# Patient Record
Sex: Female | Born: 1975 | Hispanic: No | Smoking: Never smoker
Health system: Southern US, Community
[De-identification: ages and names within clinical notes are randomized; demographics above are authoritative.]

## PROBLEM LIST (undated history)

## (undated) DIAGNOSIS — B1920 Unspecified viral hepatitis C without hepatic coma: Secondary | ICD-10-CM

## (undated) DIAGNOSIS — G43909 Migraine, unspecified, not intractable, without status migrainosus: Secondary | ICD-10-CM

---

## 2010-01-13 ENCOUNTER — Emergency Department (HOSPITAL_BASED_OUTPATIENT_CLINIC_OR_DEPARTMENT_OTHER): Admission: EM | Admit: 2010-01-13 | Discharge: 2010-01-13 | Payer: Self-pay | Admitting: Emergency Medicine

## 2010-01-31 ENCOUNTER — Emergency Department (HOSPITAL_BASED_OUTPATIENT_CLINIC_OR_DEPARTMENT_OTHER): Admission: EM | Admit: 2010-01-31 | Discharge: 2010-01-31 | Payer: Self-pay | Admitting: Emergency Medicine

## 2010-03-08 ENCOUNTER — Emergency Department (HOSPITAL_BASED_OUTPATIENT_CLINIC_OR_DEPARTMENT_OTHER): Admission: EM | Admit: 2010-03-08 | Discharge: 2010-03-08 | Payer: Self-pay | Admitting: Emergency Medicine

## 2010-04-13 ENCOUNTER — Emergency Department (HOSPITAL_BASED_OUTPATIENT_CLINIC_OR_DEPARTMENT_OTHER)
Admission: EM | Admit: 2010-04-13 | Discharge: 2010-04-13 | Payer: Self-pay | Source: Home / Self Care | Admitting: Emergency Medicine

## 2010-06-12 ENCOUNTER — Encounter (HOSPITAL_BASED_OUTPATIENT_CLINIC_OR_DEPARTMENT_OTHER): Payer: Self-pay | Admitting: Radiology

## 2010-06-12 ENCOUNTER — Emergency Department (HOSPITAL_BASED_OUTPATIENT_CLINIC_OR_DEPARTMENT_OTHER)
Admission: EM | Admit: 2010-06-12 | Discharge: 2010-06-12 | Disposition: A | Discharge status: Home / Self Care | Payer: Medicaid Other | Attending: Emergency Medicine | Admitting: Emergency Medicine

## 2010-06-12 ENCOUNTER — Emergency Department (INDEPENDENT_AMBULATORY_CARE_PROVIDER_SITE_OTHER): Payer: Medicaid Other

## 2010-06-12 DIAGNOSIS — R51 Headache: Secondary | ICD-10-CM | POA: Insufficient documentation

## 2010-06-12 HISTORY — DX: Migraine, unspecified, not intractable, without status migrainosus: G43.909

## 2010-06-12 LAB — COMPREHENSIVE METABOLIC PANEL
ALT: 63 U/L — ABNORMAL HIGH (ref 0–35)
AST: 88 U/L — ABNORMAL HIGH (ref 0–37)
Alkaline Phosphatase: 107 U/L (ref 39–117)
CO2: 22 mEq/L (ref 19–32)
Calcium: 9 mg/dL (ref 8.4–10.5)
Chloride: 109 mEq/L (ref 96–112)
GFR calc Af Amer: >60 mL/min (ref >60)
GFR calc non Af Amer: >60 mL/min (ref >60)
Glucose, Bld: 144 mg/dL — ABNORMAL HIGH (ref 70–99)
Potassium: 4.1 mEq/L (ref 3.5–5.1)
Sodium: 143 mEq/L (ref 135–145)

## 2010-06-12 LAB — CBC
HCT: 40.7 % (ref 36.0–46.0)
Hemoglobin: 14.2 g/dL (ref 12.0–15.0)
MCHC: 34.9 g/dL (ref 30.0–36.0)
RBC: 5.2 MIL/uL — ABNORMAL HIGH (ref 3.87–5.11)
WBC: 10 10*3/uL (ref 4.0–10.5)

## 2010-06-12 LAB — DIFFERENTIAL
Basophils Absolute: 0 10*3/uL (ref 0.0–0.1)
Basophils Relative: 0 % (ref 0–1)
Lymphocytes Relative: 18 % (ref 12–46)
Monocytes Absolute: 0.3 10*3/uL (ref 0.1–1.0)
Neutro Abs: 7.8 10*3/uL — ABNORMAL HIGH (ref 1.7–7.7)
Neutrophils Relative %: 78 % — ABNORMAL HIGH (ref 43–77)

## 2010-06-12 LAB — URINALYSIS, ROUTINE W REFLEX MICROSCOPIC
Bilirubin Urine: NEGATIVE
Ketones, ur: 15 mg/dL — AB
Nitrite: NEGATIVE
Protein, ur: NEGATIVE mg/dL
pH: 7.5 (ref 5.0–8.0)

## 2010-07-03 ENCOUNTER — Emergency Department (INDEPENDENT_AMBULATORY_CARE_PROVIDER_SITE_OTHER): Payer: Medicaid Other

## 2010-07-03 ENCOUNTER — Emergency Department (HOSPITAL_BASED_OUTPATIENT_CLINIC_OR_DEPARTMENT_OTHER)
Admission: EM | Admit: 2010-07-03 | Discharge: 2010-07-03 | Disposition: A | Discharge status: Home / Self Care | Payer: Medicaid Other | Attending: Emergency Medicine | Admitting: Emergency Medicine

## 2010-07-03 DIAGNOSIS — Z79899 Other long term (current) drug therapy: Secondary | ICD-10-CM | POA: Insufficient documentation

## 2010-07-03 DIAGNOSIS — R05 Cough: Secondary | ICD-10-CM | POA: Insufficient documentation

## 2010-07-03 DIAGNOSIS — R059 Cough, unspecified: Secondary | ICD-10-CM | POA: Insufficient documentation

## 2010-07-03 DIAGNOSIS — J111 Influenza due to unidentified influenza virus with other respiratory manifestations: Secondary | ICD-10-CM | POA: Insufficient documentation

## 2010-07-03 DIAGNOSIS — R109 Unspecified abdominal pain: Secondary | ICD-10-CM

## 2010-07-03 LAB — DIFFERENTIAL
Basophils Absolute: 0 10*3/uL (ref 0.0–0.1)
Eosinophils Absolute: 0.2 10*3/uL (ref 0.0–0.7)
Eosinophils Relative: 2 % (ref 0–5)
Lymphocytes Relative: 35 % (ref 12–46)
Lymphs Abs: 2.8 10*3/uL (ref 0.7–4.0)
Monocytes Absolute: 0.5 10*3/uL (ref 0.1–1.0)

## 2010-07-03 LAB — COMPREHENSIVE METABOLIC PANEL
AST: 89 U/L — ABNORMAL HIGH (ref 0–37)
BUN: 8 mg/dL (ref 6–23)
CO2: 21 mEq/L (ref 19–32)
Chloride: 111 mEq/L (ref 96–112)
Creatinine, Ser: 0.7 mg/dL (ref 0.4–1.2)
GFR calc non Af Amer: >60 mL/min (ref >60)
Glucose, Bld: 82 mg/dL (ref 70–99)
Total Bilirubin: 0.5 mg/dL (ref 0.3–1.2)

## 2010-07-03 LAB — URINALYSIS, ROUTINE W REFLEX MICROSCOPIC
Bilirubin Urine: NEGATIVE
Hgb urine dipstick: NEGATIVE
Protein, ur: NEGATIVE mg/dL
Urobilinogen, UA: 0.2 mg/dL (ref 0.0–1.0)

## 2010-07-03 LAB — CBC
HCT: 40.9 % (ref 36.0–46.0)
MCHC: 33.7 g/dL (ref 30.0–36.0)
MCV: 79.4 fL (ref 78.0–100.0)
Platelets: 219 10*3/uL (ref 150–400)
RDW: 14 % (ref 11.5–15.5)

## 2010-07-03 LAB — LIPASE, BLOOD: Lipase: 103 U/L (ref 23–300)

## 2010-07-20 LAB — URINALYSIS, ROUTINE W REFLEX MICROSCOPIC
Glucose, UA: NEGATIVE mg/dL
Ketones, ur: NEGATIVE mg/dL
Protein, ur: NEGATIVE mg/dL

## 2010-07-20 LAB — PREGNANCY, URINE: Preg Test, Ur: NEGATIVE

## 2011-02-06 ENCOUNTER — Encounter (HOSPITAL_BASED_OUTPATIENT_CLINIC_OR_DEPARTMENT_OTHER): Payer: Self-pay | Admitting: *Deleted

## 2011-02-06 ENCOUNTER — Emergency Department (HOSPITAL_BASED_OUTPATIENT_CLINIC_OR_DEPARTMENT_OTHER)
Admission: EM | Admit: 2011-02-06 | Discharge: 2011-02-06 | Disposition: A | Discharge status: Home / Self Care | Payer: Medicaid Other | Attending: Emergency Medicine | Admitting: Emergency Medicine

## 2011-02-06 DIAGNOSIS — Z79899 Other long term (current) drug therapy: Secondary | ICD-10-CM | POA: Insufficient documentation

## 2011-02-06 DIAGNOSIS — L03119 Cellulitis of unspecified part of limb: Secondary | ICD-10-CM | POA: Insufficient documentation

## 2011-02-06 DIAGNOSIS — L02619 Cutaneous abscess of unspecified foot: Secondary | ICD-10-CM | POA: Insufficient documentation

## 2011-02-06 HISTORY — DX: Unspecified viral hepatitis C without hepatic coma: B19.20

## 2011-02-06 MED ORDER — OXYCODONE-ACETAMINOPHEN 5-325 MG PO TABS
2.0000 | ORAL_TABLET | Freq: Once | ORAL | Status: AC
  Administered 2011-02-06: 2 via ORAL
  Filled 2011-02-06: qty 2

## 2011-02-06 MED ORDER — LIDOCAINE HCL (PF) 1 % IJ SOLN
5.0000 mL | Freq: Once | INTRAMUSCULAR | Status: AC
  Administered 2011-02-06: 5 mL
  Filled 2011-02-06: qty 5

## 2011-02-06 MED ORDER — SULFAMETHOXAZOLE-TMP DS 800-160 MG PO TABS
2.0000 | ORAL_TABLET | Freq: Once | ORAL | Status: AC
  Administered 2011-02-06: 2 via ORAL
  Filled 2011-02-06: qty 2

## 2011-02-06 MED ORDER — OXYCODONE-ACETAMINOPHEN 5-325 MG PO TABS: 2.0000 | ORAL_TABLET | Freq: Four times a day (QID) | ORAL | Status: DC | PRN

## 2011-02-06 MED ORDER — SULFAMETHOXAZOLE-TMP DS 800-160 MG PO TABS: 2.0000 | ORAL_TABLET | Freq: Two times a day (BID) | ORAL | Status: AC

## 2011-02-06 MED ORDER — CEPHALEXIN 250 MG PO CAPS
500.0000 mg | ORAL_CAPSULE | Freq: Once | ORAL | Status: AC
  Administered 2011-02-06: 500 mg via ORAL
  Filled 2011-02-06: qty 2

## 2011-02-06 MED ORDER — LIDOCAINE 4 % EX CREA
TOPICAL_CREAM | Freq: Once | CUTANEOUS | Status: AC
  Administered 2011-02-06: 1 via TOPICAL
  Filled 2011-02-06: qty 5

## 2011-02-06 MED ORDER — CEPHALEXIN 500 MG PO CAPS: 500.0000 mg | ORAL_CAPSULE | Freq: Four times a day (QID) | ORAL | Status: AC

## 2011-02-06 NOTE — ED Notes (Signed)
Infection right great toe x 1 week. Hot, red, swollen, painful and hard to walk on her right foot.

## 2011-02-06 NOTE — ED Provider Notes (Signed)
History     CSN: [id number] Arrival date & time: 02/06/2011  7:43 PM  Chief Complaint  Patient presents with  . Cellulitis     HPI Patient is a 35 yo F with no prior history of skin infections who presents with 4 day history of right toe pain that is a 10/10.  Palpation and movement make it worse.  Nothing alleviates her pain.  She noticed itching first around a small cut and now she notes sharp pain.  She denies nausea, vomiting, or fever.  She has not been treated for this.  The pain radiates to her foot.  There are no other modifying factors. Past Medical History  Diagnosis Date  . Migraines   . Hepatitis C     Past Surgical History  Procedure Date  . Cesarean section     History reviewed. No pertinent family history.  History  Substance Use Topics  . Smoking status: Never Smoker   . Smokeless tobacco: Not on file  . Alcohol Use: No    OB History    Grav Para Term Preterm Abortions TAB SAB Ect Mult Living                  Review of Systems  All other systems reviewed and are negative.    Allergies  Review of patient's allergies indicates no known allergies.  Home Medications   Current Outpatient Rx  Name Route Sig Dispense Refill  . VITAMIN D 1000 UNITS PO TABS Oral Take 2,000 Units by mouth daily.      Marland Kitchen PROMETHAZINE HCL 25 MG PO TABS Oral Take 25 mg by mouth every 6 (six) hours as needed. For nausea     . SERTRALINE HCL 100 MG PO TABS Oral Take 200 mg by mouth daily.      . SUMATRIPTAN SUCCINATE 100 MG PO TABS Oral Take 100 mg by mouth every 2 (two) hours as needed. For migraine     . VITAMIN C 500 MG PO TABS Oral Take 500 mg by mouth daily.        BP 110/71  Pulse 82  Temp(Src) 98.2 F (36.8 C) (Oral)  Resp 22  SpO2 100%  Physical Exam  Nursing note and vitals reviewed. Constitutional: She is oriented to person, place, and time. She appears well-developed and well-nourished.  HENT:  Head: Normocephalic and atraumatic.  Eyes: Conjunctivae and  EOM are normal. Pupils are equal, round, and reactive to light.  Neck: Normal range of motion.  Cardiovascular: Normal rate, regular rhythm and normal heart sounds.  Exam reveals no gallop and no friction rub.   No murmur heard. Pulmonary/Chest: Effort normal and breath sounds normal. No respiratory distress. She has no wheezes. She has no rales.  Abdominal: Soft. Bowel sounds are normal. She exhibits no distension. There is no tenderness. There is no rebound and no guarding.  Musculoskeletal: Normal range of motion. She exhibits edema and tenderness.       Noted over the proximal phalanx of the R great toe dorsally  Neurological: She is alert and oriented to person, place, and time. No cranial nerve deficit. She exhibits normal muscle tone. Coordination normal.  Skin: Skin is warm and dry.       Patient has erythematous area overlying the dorsum of the proximal phalanx of the R great toe with underlying fluctuance  Psychiatric: She has a normal mood and affect.    ED Course  INCISION AND DRAINAGE Date/Time: 02/06/2011 9:30 PM Performed by:  Larren Copes Authorized by: Cyndra Numbers Consent: Verbal consent obtained. Written consent not obtained. Risks and benefits: risks, benefits and alternatives were discussed Consent given by: patient Patient understanding: patient states understanding of the procedure being performed Relevant documents: relevant documents present and verified Required items: required blood products, implants, devices, and special equipment available Patient identity confirmed: verbally with patient Type: abscess Body area: lower extremity Location details: right big toe Anesthesia: local infiltration Local anesthetic: lidocaine 1% with epinephrine and topical anesthetic Anesthetic total: 5 ml Patient sedated: no Scalpel size: 11 Incision type: single straight Complexity: simple Drainage: purulent and bloody Drainage amount: scant Wound treatment: wound left  open Packing material: none Patient tolerance: Patient tolerated the procedure well with no immediate complications.   Labs Reviewed - No data to display No results found.   1. Cellulitis and abscess of foot       MDM  Patient was evaluated and had cellulitis of her R great toe with underlying abscess.  She was treated with percocet, bactrim, and keflex.  I and D was performed.  Patient tolerated this well.  10 days of antibiotics were given as well as pain meds.  Patient was told to soak her toe 2 x a day in warm soapy water and to push on the area with clean hands to express and additional pus.  Patient stated understanding of plan and was discharged home in good condition.        Cyndra Numbers, MD 02/07/11 1359

## 2011-02-06 NOTE — ED Notes (Signed)
I&D tray at bedside.

## 2011-02-08 ENCOUNTER — Encounter (HOSPITAL_BASED_OUTPATIENT_CLINIC_OR_DEPARTMENT_OTHER): Payer: Self-pay | Admitting: *Deleted

## 2011-02-08 ENCOUNTER — Emergency Department (HOSPITAL_BASED_OUTPATIENT_CLINIC_OR_DEPARTMENT_OTHER)
Admission: EM | Admit: 2011-02-08 | Discharge: 2011-02-08 | Disposition: A | Discharge status: Home / Self Care | Payer: Medicaid Other | Attending: Emergency Medicine | Admitting: Emergency Medicine

## 2011-02-08 DIAGNOSIS — Z79899 Other long term (current) drug therapy: Secondary | ICD-10-CM | POA: Insufficient documentation

## 2011-02-08 DIAGNOSIS — L089 Local infection of the skin and subcutaneous tissue, unspecified: Secondary | ICD-10-CM | POA: Insufficient documentation

## 2011-02-08 DIAGNOSIS — R209 Unspecified disturbances of skin sensation: Secondary | ICD-10-CM | POA: Insufficient documentation

## 2011-02-08 NOTE — ED Provider Notes (Signed)
History     CSN: [id number] Arrival date & time: 02/08/2011  7:15 PM  Chief Complaint  Patient presents with  . Foot Pain    (Consider location/radiation/quality/duration/timing/severity/associated sxs/prior treatment) Patient is a 35 y.o. female presenting with wound check. The history is provided by the patient. No language interpreter was used.  Wound Check  She was treated in the ED 2 to 3 days ago. Previous treatment in the ED includes I&D of abscess. Treatments since wound repair include oral antibiotics. Her temperature was unmeasured prior to arrival. There has been clear discharge from the wound. The redness has improved. The swelling has improved. The pain has improved. There is difficulty moving the extremity or digit due to pain.  Pt here for recheck of skin infection.  Pt reports area feels some better.  Past Medical History  Diagnosis Date  . Migraines   . Hepatitis C     Past Surgical History  Procedure Date  . Cesarean section     History reviewed. No pertinent family history.  History  Substance Use Topics  . Smoking status: Never Smoker   . Smokeless tobacco: Not on file  . Alcohol Use: No    OB History    Grav Para Term Preterm Abortions TAB SAB Ect Mult Living                  Review of Systems  Skin: Positive for wound.  All other systems reviewed and are negative.    Allergies  Review of patient's allergies indicates no known allergies.  Home Medications   Current Outpatient Rx  Name Route Sig Dispense Refill  . CEPHALEXIN 500 MG PO CAPS Oral Take 1 capsule (500 mg total) by mouth 4 (four) times daily. 40 capsule 0  . VITAMIN D 1000 UNITS PO TABS Oral Take 1,000 Units by mouth daily.     Marland Kitchen REBIF [place] Subcutaneous Inject 5 mLs into the skin once a week.      . OXYCODONE-ACETAMINOPHEN 5-325 MG PO TABS Oral Take 1 tablet by mouth 2 (two) times daily as needed. For pain     . PROMETHAZINE HCL 25 MG PO TABS Oral Take 25 mg by mouth every 6  (six) hours as needed. For nausea     . SERTRALINE HCL 100 MG PO TABS Oral Take 200 mg by mouth daily.      Marland Kitchen VITAMIN C 500 MG PO TABS Oral Take 500 mg by mouth daily.      . SULFAMETHOXAZOLE-TMP DS 800-160 MG PO TABS Oral Take 2 tablets by mouth 2 (two) times daily. 40 tablet 0  . SUMATRIPTAN SUCCINATE 100 MG PO TABS Oral Take 100 mg by mouth every 2 (two) hours as needed. For migraine       BP 111/66  Pulse 77  Temp 98.6 F (37 C)  Resp 20  SpO2 100%  Physical Exam  Nursing note and vitals reviewed. Constitutional: She appears well-developed and well-nourished.  Musculoskeletal: She exhibits tenderness.       Healing incised area right great toe,  No drainage  Skin: Skin is warm and dry.    ED Course  Procedures (including critical care time)  Labs Reviewed - No data to display No results found.   No diagnosis found.    MDM  Pt advised continue antibiotics.  Return if any problems.        Langston Masker, [place] 02/09/11 2212

## 2011-02-08 NOTE — ED Notes (Signed)
Seen Tuesday night for infection of right great toe states was told to come back if not getting better states it is still painful and having drainage

## 2011-02-23 NOTE — ED Provider Notes (Signed)
Evaluation and management procedures were performed by the PA/NP under my supervision/collaboration.    Cyann Venti D Mio Schellinger, MD 02/23/11 1930 

## 2013-07-06 ENCOUNTER — Ambulatory Visit: Payer: Medicaid Other | Attending: Orthopaedic Surgery | Admitting: Physical Therapy

## 2013-07-06 DIAGNOSIS — IMO0001 Reserved for inherently not codable concepts without codable children: Secondary | ICD-10-CM | POA: Diagnosis not present

## 2013-07-06 DIAGNOSIS — M25569 Pain in unspecified knee: Secondary | ICD-10-CM | POA: Diagnosis not present

## 2015-03-16 ENCOUNTER — Emergency Department (HOSPITAL_BASED_OUTPATIENT_CLINIC_OR_DEPARTMENT_OTHER)
Admission: EM | Admit: 2015-03-16 | Discharge: 2015-03-17 | Disposition: A | Discharge status: Home / Self Care | Payer: 59 | Attending: Emergency Medicine | Admitting: Emergency Medicine

## 2015-03-16 ENCOUNTER — Encounter (HOSPITAL_BASED_OUTPATIENT_CLINIC_OR_DEPARTMENT_OTHER): Payer: Self-pay

## 2015-03-16 DIAGNOSIS — Z8619 Personal history of other infectious and parasitic diseases: Secondary | ICD-10-CM | POA: Insufficient documentation

## 2015-03-16 DIAGNOSIS — R51 Headache: Secondary | ICD-10-CM | POA: Diagnosis present

## 2015-03-16 DIAGNOSIS — G43909 Migraine, unspecified, not intractable, without status migrainosus: Secondary | ICD-10-CM | POA: Insufficient documentation

## 2015-03-16 MED ORDER — SODIUM CHLORIDE 0.9 % IV SOLN
1000.0000 mL | INTRAVENOUS | Status: DC
  Administered 2015-03-17: 1000 mL via INTRAVENOUS

## 2015-03-16 MED ORDER — KETOROLAC TROMETHAMINE 30 MG/ML IJ SOLN
30.0000 mg | Freq: Once | INTRAMUSCULAR | Status: AC
  Administered 2015-03-16: 30 mg via INTRAVENOUS
  Filled 2015-03-16: qty 1

## 2015-03-16 MED ORDER — DIPHENHYDRAMINE HCL 50 MG/ML IJ SOLN
25.0000 mg | Freq: Once | INTRAMUSCULAR | Status: AC
  Administered 2015-03-16: 25 mg via INTRAVENOUS
  Filled 2015-03-16: qty 1

## 2015-03-16 MED ORDER — SODIUM CHLORIDE 0.9 % IV SOLN
1000.0000 mL | Freq: Once | INTRAVENOUS | Status: AC
  Administered 2015-03-16: 1000 mL via INTRAVENOUS

## 2015-03-16 MED ORDER — METOCLOPRAMIDE HCL 5 MG/ML IJ SOLN
10.0000 mg | Freq: Once | INTRAMUSCULAR | Status: AC
  Administered 2015-03-16: 10 mg via INTRAVENOUS
  Filled 2015-03-16: qty 2

## 2015-03-16 NOTE — ED Notes (Addendum)
HA x today-hx of migraine HA

## 2015-03-16 NOTE — ED Notes (Signed)
MD at bedside. 

## 2015-03-16 NOTE — ED Provider Notes (Signed)
CSN: [id number]     Arrival date & time 03/16/15  2137 History  By signing my name below, I, Arianna Nassar, attest that this documentation has been prepared under the direction and in the presence of Arby BarretteMarcy Gavyn Zoss, MD. Electronically Signed: Octavia HeirArianna Nassar, ED Scribe. 03/17/2015. 12:37 AM.    Chief Complaint  Patient presents with  . Headache      The history is provided by the patient. No language interpreter was used.   HPI Comments: Wendy Brennan is a 39 y.o. female who presents to the Emergency Department complaining of constant, gradual worsening headache with associated nausea onset this morning. Pt describes the headache as a sharp and throbbing sensation. She is sensitive to light and sensitive to sound. She states she woke up this morning with the headache. Pt has been having migraines for the past two years. Pt reports she took her migraine medication twice to alleviate the symptoms with no relief.  She denies fever, sore throat, nasal drainage, vomiting, numbness, difficulty ambulating, cough, chest pain, urinary symptoms, swelling in legs, rash, and loss of consciousness. Pt has no known drug allergies.   Past Medical History  Diagnosis Date  . Migraines   . Hepatitis C    Past Surgical History  Procedure Laterality Date  . Cesarean section     No family history on file. Social History  Substance Use Topics  . Smoking status: Never Smoker   . Smokeless tobacco: None  . Alcohol Use: No   OB History    No data available     Review of Systems  10 Systems reviewed and are negative for acute change except as noted in the HPI.   Allergies  Review of patient's allergies indicates no known allergies.  Home Medications   Prior to Admission medications   Medication Sig Start Date End Date Taking? Authorizing Provider  UNKNOWN TO PATIENT meds x 2 for HA-does not know name   Yes Historical Provider, MD  ibuprofen (ADVIL,MOTRIN) 800 MG tablet Take 1 tablet (800 mg  total) by mouth 3 (three) times daily. 03/17/15   Arby BarretteMarcy Brayleigh Rybacki, MD  traMADol (ULTRAM) 50 MG tablet Take 2 tablets (100 mg total) by mouth every 6 (six) hours as needed. 03/17/15   Arby BarretteMarcy Sole Lengacher, MD   Triage vitals: BP 148/98 mmHg  Pulse 77  Temp(Src) 98 F (36.7 C) (Oral)  Resp 20  Ht 5\' 3"  (1.6 m)  Wt 187 lb (84.823 kg)  BMI 33.13 kg/m2  SpO2 100%  LMP 03/02/2015 Physical Exam  Constitutional: She is oriented to person, place, and time. She appears well-developed and well-nourished.  HENT:  Head: Normocephalic and atraumatic.  Bilateral TMs normal. Oropharynx clear. Dentition intact. No facial swelling. No neck stiffness.  Eyes: EOM are normal. Pupils are equal, round, and reactive to light.  Neck: Neck supple.  Cardiovascular: Normal rate, regular rhythm, normal heart sounds and intact distal pulses.   Pulmonary/Chest: Effort normal and breath sounds normal.  Abdominal: Soft. Bowel sounds are normal. She exhibits no distension. There is no tenderness.  Musculoskeletal: Normal range of motion. She exhibits no edema or tenderness.  Neurological: She is alert and oriented to person, place, and time. She has normal strength. No cranial nerve deficit. She exhibits normal muscle tone. Coordination normal. GCS eye subscore is 4. GCS verbal subscore is 5. GCS motor subscore is 6.  Skin: Skin is warm, dry and intact.  Psychiatric: She has a normal mood and affect.    ED Course  Procedures  DIAGNOSTIC STUDIES: Oxygen Saturation is 100% on RA, normal by my interpretation.  COORDINATION OF CARE:  11:09 PM Discussed treatment plan which includes with pt at bedside and pt agreed to plan.  Labs Review Labs Reviewed - No data to display  Imaging Review No results found. I have personally reviewed and evaluated these images and lab results as part of my medical decision-making.   EKG Interpretation None     Recheck 00 20 patient reports improvement. MDM   Final diagnoses:   Migraine without status migrainosus, not intractable, unspecified migraine type   Patient has headache with typical associated migrainous symptoms of light and sound sensitivity. No associated fever, neck stiffness or general constitutional illness. The patient is treated for migraine headache and given tramadol and ibuprofen for pain control at home. She is advised to follow-up with her family physician or use the resource guide for follow-up.  Arby Barrette, MD 03/17/15 [phone]

## 2015-03-16 NOTE — ED Notes (Signed)
Pt asking for pain medication. Pt made aware she would have to be evaluated by doctor to receive pain medication. Pt assured MD would be in to evaluate as soon as possible.

## 2015-03-17 MED ORDER — TRAMADOL HCL 50 MG PO TABS: 100.0000 mg | ORAL_TABLET | Freq: Four times a day (QID) | ORAL | Status: AC | PRN

## 2015-03-17 MED ORDER — IBUPROFEN 800 MG PO TABS
800.0000 mg | ORAL_TABLET | Freq: Three times a day (TID) | ORAL | Status: AC
Start: 2015-03-17 — End: ?

## 2015-03-17 MED ORDER — TRAMADOL HCL 50 MG PO TABS
100.0000 mg | ORAL_TABLET | Freq: Once | ORAL | Status: AC
  Administered 2015-03-17: 100 mg via ORAL
  Filled 2015-03-17: qty 2

## 2015-03-17 NOTE — Discharge Instructions (Signed)
Migraine Headache °A migraine headache is an intense, throbbing pain on one or both sides of your head. A migraine can last for 30 minutes to several hours. °CAUSES  °The exact cause of a migraine headache is not always known. However, a migraine may be caused when nerves in the brain become irritated and release chemicals that cause inflammation. This causes pain. °Certain things may also trigger migraines, such as: °· Alcohol. °· Smoking. °· Stress. °· Menstruation. °· Aged cheeses. °· Foods or drinks that contain nitrates, glutamate, aspartame, or tyramine. °· Lack of sleep. °· Chocolate. °· Caffeine. °· Hunger. °· Physical exertion. °· Fatigue. °· Medicines used to treat chest pain (nitroglycerine), birth control pills, estrogen, and some blood pressure medicines. °SIGNS AND SYMPTOMS °· Pain on one or both sides of your head. °· Pulsating or throbbing pain. °· Severe pain that prevents daily activities. °· Pain that is aggravated by any physical activity. °· Nausea, vomiting, or both. °· Dizziness. °· Pain with exposure to bright lights, loud noises, or activity. °· General sensitivity to bright lights, loud noises, or smells. °Before you get a migraine, you may get warning signs that a migraine is coming (aura). An aura may include: °· Seeing flashing lights. °· Seeing bright spots, halos, or zigzag lines. °· Having tunnel vision or blurred vision. °· Having feelings of numbness or tingling. °· Having trouble talking. °· Having muscle weakness. °DIAGNOSIS  °A migraine headache is often diagnosed based on: °· Symptoms. °· Physical exam. °· A CT scan or MRI of your head. These imaging tests cannot diagnose migraines, but they can help rule out other causes of headaches. °TREATMENT °Medicines may be given for pain and nausea. Medicines can also be given to help prevent recurrent migraines.  °HOME CARE INSTRUCTIONS °· Only take over-the-counter or prescription medicines for pain or discomfort as directed by your  health care provider. The use of long-term narcotics is not recommended. °· Lie down in a dark, quiet room when you have a migraine. °· Keep a journal to find out what may trigger your migraine headaches. For example, write down: °¨ What you eat and drink. °¨ How much sleep you get. °¨ Any change to your diet or medicines. °· Limit alcohol consumption. °· Quit smoking if you smoke. °· Get 7-9 hours of sleep, or as recommended by your health care provider. °· Limit stress. °· Keep lights dim if bright lights bother you and make your migraines worse. °SEEK IMMEDIATE MEDICAL CARE IF:  °· Your migraine becomes severe. °· You have a fever. °· You have a stiff neck. °· You have vision loss. °· You have muscular weakness or loss of muscle control. °· You start losing your balance or have trouble walking. °· You feel faint or pass out. °· You have severe symptoms that are different from your first symptoms. °MAKE SURE YOU:  °· Understand these instructions. °· Will watch your condition. °· Will get help right away if you are not doing well or get worse. °  °This information is not intended to replace advice given to you by your health care provider. Make sure you discuss any questions you have with your health care provider. °  °Document Released: 04/23/2005 Document Revised: 05/14/2014 Document Reviewed: 12/29/2012 °Elsevier Interactive Patient Education ©2016 Elsevier Inc. ° ° °Emergency Department Resource Guide °1) Find a Doctor and Pay Out of Pocket °Although you won't have to find out who is covered by your insurance plan, it is a good idea to   around and get recommendations. You will then need to call the office and see if the doctor you have chosen will accept you as a new patient and what types of options they offer for patients who are self-pay. Some doctors offer discounts or will set up payment plans for their patients who do not have insurance, but you will need to ask so you aren't surprised when you get to  your appointment.  2) Contact Your Local Health Department Not all health departments have doctors that can see patients for sick visits, but many do, so it is worth a call to see if yours does. If you don't know where your local health department is, you can check in your phone book. The CDC also has a tool to help you locate your state's health department, and many state websites also have listings of all of their local health departments.  3) Find a [facility] If your illness is not likely to be very severe or complicated, you may want to try a walk in clinic. These are popping up all over the country in pharmacies, drugstores, and shopping centers. They're usually staffed by nurse practitioners or physician assistants that have been trained to treat common illnesses and complaints. They're usually fairly quick and inexpensive. However, if you have serious medical issues or chronic medical problems, these are probably not your best option.  No Primary Care Doctor: - Call Health Connect at  [phone] - they can help you locate a primary care doctor that  accepts your insurance, provides certain services, etc. - Physician Referral Service- [phone]  Chronic Pain Problems: Organization         Address  Phone   Notes  [facility]  [phone] Patients need to be referred by their primary care doctor.   Medication Assistance: Organization         Address  Phone   Notes  [facility] Medication [facility] [place], [place] [address] [phone] --Must be a resident of [facility] -- Must have NO insurance coverage whatsoever (no Medicaid/ Medicare, etc.) -- The pt. MUST have a primary care doctor that directs their care regularly and follows them in the community   [facility]  [phone]   [employer]  [phone]    Agencies that provide inexpensive medical care: Organization         Address  Phone    Notes  [place]  [phone]   [facility]    [phone]   [facility] [place], [place] [address] [phone]   [place] [address] [place] [phone]   Planned Parenthood    [phone]   [facility]    [phone]   [place] and [place] [address], [place] Phone:  [phone], Fax:  [phone] Hours of Operation:  9 am - 6 pm, M-F.  Also accepts Medicaid/Medicare and self-pay.  [facility] for [place], [address], [place] Phone: [phone], Fax: [phone]. Hours of Operation:  8:30 am - 5:30 pm, M-F.  Also accepts Medicaid and self-pay.  [facility] [place] [address], [place] Phone: [phone]   [place], [place], [place] [phone], Ext. [phone] Mondays & Thursdays: 7-9 AM.  First 15 patients are seen  on a first come, first serve basis.    [place] Providers:  Organization         Address  Phone   Notes  [facility] [address], [place] [phone] Also accepts self-pay patients.  [facility] [address], [place]  [phone]   [place], [address], [place] [phone]   [facility] [address], [place] [phone]   Lucianne Lei [address], [place]   [phone] Only accepts [place] Access [place] patients after they have their name applied to their card.   Self-Pay (no insurance) in [facility]:  Organization         Address  Phone   Notes  Sickle Cell Patients, [facility] Internal Medicine [place] [phone]   [facility] Urgent Care [place]ina [phone]   [facility]  [facility] [place]  [place], [place], [place] [phone]   [facility]/Dr. Osei-Bonsu  [address] [place], [place] [phone] Phone number for both [place] and [place] locations is the same.  [facility] [address], [place] [phone]   [facility] [address] [place] or [address] [phone]   [facility] [address] [place] [phone], phone; [phone], fax Sees patients 1st and 3rd Saturday of every month.  Must not qualify for public or private insurance (i.e. Medicaid, Medicare, [place] Health Choice, Veterans' Benefits)  Household income should be no more than 200% of the poverty level The clinic cannot treat you if you are pregnant or think you are pregnant  Sexually transmitted diseases are not treated at the clinic.    Dental Care: Organization         Address  Phone  Notes  [facility] Department of [facility] [place] [phone] Accepts children up to age 68 who are enrolled in [place] or [place]; pregnant women with a Medicaid card; and children who have applied for Medicaid or [place] Health Choice, but were declined, whose parents can pay a reduced fee at time of service.  [facility] Department of [facility]  [address], [place] [phone] Accepts children up to age 19 who are enrolled in [place] or [place]; pregnant women with a Medicaid card; and children who have applied for Medicaid or [place] Health Choice, but were declined, whose parents can pay a reduced fee at time of service.  [place] Adult [facility] Access PROGRAM  [place] [phone] Patients are seen by appointment only. Walk-ins are not accepted. [place] will see patients 35 years of age  and older. Monday - Tuesday (8am-5pm) Most Wednesdays (8:30-5pm) $30 per visit, cash only  [facility] Adult Dental Access PROGRAM  [address], [facility] [phone] Patients are seen by appointment only. Walk-ins are not accepted. [place] will see patients 34 years of age and older. One Wednesday Evening (Monthly: Volunteer Based).  $30 per visit, cash only  [place]  [phone] for adults; Children under age 56, call Graduate Pediatric Dentistry at [phone].  Children aged 72-14, please call [phone] to request a pediatric application.  Dental services are provided in all areas of dental care including fillings, crowns and bridges, complete and partial dentures, implants, gum treatment, root canals, and extractions. Preventive care is also provided. Treatment is provided to both adults and children. Patients are selected via a lottery and there is often a waiting list.   [facility] [address] [place]  [phone] [email or web address]   [facility] [address] [place], [place] [phone], Ext. [phone] Second and Fourth Thursday of each month, opens at 6:30 AM; Clinic ends at 9 AM.  Patients are seen on a first-come first-served basis, and a limited number are seen during each clinic.   [facility]  [address] [place] Danker [place], [place] [phone]   Eligibility Requirements You must have lived in [place], [place], or [place] counties for at least the last three months.   You cannot be eligible for state or federal sponsored [employer], including [employer], [place], or [employer].   You generally cannot be eligible for healthcare insurance through your employer.    How to apply: Eligibility screenings are held every Tuesday and Wednesday afternoon from 1:00 pm until 4:00 pm. You do not need an appointment for the interview!  [facility] [address], [place], [place]   [place]  [place] Department  [place]  [phone]    Behavioral Health Resources in the Community: Intensive Outpatient Programs Organization         Address  Phone  Notes  [place]. [address] [place], [place] [phone]   [facility] [address], [place], [place]   [facility]: [facility] [address], [place], [place]   [place] [address]  [place], [place] or [phone]   Substance Abuse Resources Organization         Address  Phone  Notes  [facility] Drug Services  [phone]   [place]  [phone]   [facility] [place]   Chinita Pester  [phone]   Residential & Outpatient Substance Abuse Program  [phone]   Psychological Services Organization         Address  Phone  Notes  [facility] [place]  [place]  [phone]   [place] [address], [place] or [phone]    Mobile Crisis Teams Organization         Address  Phone  Notes  [facility], Mobile Crisis Care Unit  [phone]   [facility]  [address] [place], [place]   [place] [address], [place] [phone]    Self-Help/Support Groups Organization         Address  Phone             Notes  [place]. of [place] - variety of support groups  [place] Call for more information  Narcotics Anonymous (NA), [facility] [address], [place]  2 meetings at this location   Special educational needs teacher         Address  Phone  Notes  [facility] [place],    [place]  [phone]  [facility] ° [address], [place], [place] [phone]   °[facility] [address], [place] [phone] Admissions: 8am-3pm M-F  °[facility] [address]    °[place], [place] [phone]   °[facility] [address], [place], [place] [phone]   °[facility] [address]  °[place], [place] [phone]   °[facility] - Intensive Outpatient [address], [place], [place] [phone]   °[facility] [address]  °[place], [place] 1-877-615-2722 or [phone]   °[facility] ([facility]) [address] , [place] [phone] Accepts Medicaid  °[facility] [address]  °[place] 1-800-659-3381 Substance Abuse/Addiction Treatment  ° °[place] Behavioral Health Resources °Organization         Address  Phone  Notes  °[facility]  ([phone]) [phone]   °Julie Brannon, PhD [address] [place], [place]   ([phone]) [phone] or (336) [phone]   °[facility]   [address] °[place], [place] ([phone]) [phone]   °[facility] [address], [place], [place] ([phone]) [phone] Insurance/Medicaid/sponsorship through [facility]  °[facility] [address]                                    [place], [place] [phone] Therapy/tele-psych/case  °[facility] [address]  ° [place], [place] ([phone]    °Dr. Arfeen  ([phone]) [phone]   °[facility] [place]  [employer] [place] [facility] 1) [address], [place] °2) [address], [place] °3)  [address], [place] ([phone]) [phone] °([phone]) [phone] ° °(336) [phone]   °[place] Child Abuse Hotline (336) 342-1394 or (336) [phone] (After Hours)    ° ° ° °

## 2015-03-17 NOTE — ED Notes (Signed)
MD at bedside. 

## 2019-05-04 ENCOUNTER — Other Ambulatory Visit: Payer: Self-pay

## 2019-05-04 ENCOUNTER — Emergency Department (HOSPITAL_BASED_OUTPATIENT_CLINIC_OR_DEPARTMENT_OTHER)
Admission: EM | Admit: 2019-05-04 | Discharge: 2019-05-04 | Disposition: A | Discharge status: Home / Self Care | Payer: BLUE CROSS/BLUE SHIELD | Attending: Emergency Medicine | Admitting: Emergency Medicine

## 2019-05-04 ENCOUNTER — Encounter (HOSPITAL_BASED_OUTPATIENT_CLINIC_OR_DEPARTMENT_OTHER): Payer: Self-pay | Admitting: *Deleted

## 2019-05-04 ENCOUNTER — Emergency Department (HOSPITAL_BASED_OUTPATIENT_CLINIC_OR_DEPARTMENT_OTHER): Payer: BLUE CROSS/BLUE SHIELD

## 2019-05-04 DIAGNOSIS — R519 Headache, unspecified: Secondary | ICD-10-CM | POA: Diagnosis not present

## 2019-05-04 DIAGNOSIS — Z79899 Other long term (current) drug therapy: Secondary | ICD-10-CM | POA: Diagnosis not present

## 2019-05-04 DIAGNOSIS — R071 Chest pain on breathing: Secondary | ICD-10-CM | POA: Insufficient documentation

## 2019-05-04 DIAGNOSIS — U071 COVID-19: Secondary | ICD-10-CM | POA: Diagnosis not present

## 2019-05-04 DIAGNOSIS — R079 Chest pain, unspecified: Secondary | ICD-10-CM | POA: Diagnosis not present

## 2019-05-04 DIAGNOSIS — N3001 Acute cystitis with hematuria: Secondary | ICD-10-CM | POA: Diagnosis not present

## 2019-05-04 DIAGNOSIS — R05 Cough: Secondary | ICD-10-CM | POA: Diagnosis not present

## 2019-05-04 DIAGNOSIS — M7918 Myalgia, other site: Secondary | ICD-10-CM | POA: Diagnosis present

## 2019-05-04 DIAGNOSIS — Z20822 Contact with and (suspected) exposure to covid-19: Secondary | ICD-10-CM

## 2019-05-04 DIAGNOSIS — R0789 Other chest pain: Secondary | ICD-10-CM

## 2019-05-04 LAB — COMPREHENSIVE METABOLIC PANEL
ALT: 17 U/L (ref 0–44)
AST: 22 U/L (ref 15–41)
Albumin: 3.7 g/dL (ref 3.5–5.0)
Alkaline Phosphatase: 60 U/L (ref 38–126)
Anion gap: 6 (ref 5–15)
BUN: 10 mg/dL (ref 6–20)
CO2: 23 mmol/L (ref 22–32)
Calcium: 8.2 mg/dL — ABNORMAL LOW (ref 8.9–10.3)
Chloride: 107 mmol/L (ref 98–111)
Creatinine, Ser: 0.78 mg/dL (ref 0.44–1.00)
GFR calc Af Amer: >60 mL/min (ref >60)
GFR calc non Af Amer: >60 mL/min (ref >60)
Glucose, Bld: 90 mg/dL (ref 70–99)
Potassium: 3.7 mmol/L (ref 3.5–5.1)
Sodium: 136 mmol/L (ref 135–145)
Total Bilirubin: 0.5 mg/dL (ref 0.3–1.2)
Total Protein: 7 g/dL (ref 6.5–8.1)

## 2019-05-04 LAB — CBC WITH DIFFERENTIAL/PLATELET
Abs Immature Granulocytes: 0.01 10*3/uL (ref 0.00–0.07)
Basophils Absolute: 0 10*3/uL (ref 0.0–0.1)
Basophils Relative: 0 %
Eosinophils Absolute: 0 10*3/uL (ref 0.0–0.5)
Eosinophils Relative: 0 %
HCT: 42.4 % (ref 36.0–46.0)
Hemoglobin: 14 g/dL (ref 12.0–15.0)
Immature Granulocytes: 0 %
Lymphocytes Relative: 32 %
Lymphs Abs: 1.1 10*3/uL (ref 0.7–4.0)
MCH: 29.1 pg (ref 26.0–34.0)
MCHC: 33 g/dL (ref 30.0–36.0)
MCV: 88.1 fL (ref 80.0–100.0)
Monocytes Absolute: 0.4 10*3/uL (ref 0.1–1.0)
Monocytes Relative: 10 %
Neutro Abs: 2.1 10*3/uL (ref 1.7–7.7)
Neutrophils Relative %: 58 %
Platelets: 164 10*3/uL (ref 150–400)
RBC: 4.81 MIL/uL (ref 3.87–5.11)
RDW: 12.8 % (ref 11.5–15.5)
WBC: 3.6 10*3/uL — ABNORMAL LOW (ref 4.0–10.5)
nRBC: 0 % (ref 0.0–0.2)

## 2019-05-04 LAB — URINALYSIS, ROUTINE W REFLEX MICROSCOPIC
Bilirubin Urine: NEGATIVE
Glucose, UA: NEGATIVE mg/dL
Ketones, ur: NEGATIVE mg/dL
Leukocytes,Ua: NEGATIVE
Nitrite: NEGATIVE
Protein, ur: 30 mg/dL — AB
Specific Gravity, Urine: 1.02 (ref 1.005–1.030)
pH: 6.5 (ref 5.0–8.0)

## 2019-05-04 LAB — PREGNANCY, URINE: Preg Test, Ur: NEGATIVE

## 2019-05-04 LAB — URINALYSIS, MICROSCOPIC (REFLEX): RBC / HPF: >50 RBC/hpf (ref 0–5)

## 2019-05-04 MED ORDER — SODIUM CHLORIDE 0.9 % IV BOLUS
1000.0000 mL | Freq: Once | INTRAVENOUS | Status: AC
  Administered 2019-05-04: 1000 mL via INTRAVENOUS

## 2019-05-04 MED ORDER — BENZONATATE 100 MG PO CAPS: 100.0000 mg | ORAL_CAPSULE | Freq: Three times a day (TID) | ORAL | 0 refills | Status: AC

## 2019-05-04 MED ORDER — ONDANSETRON HCL 4 MG/2ML IJ SOLN
4.0000 mg | Freq: Once | INTRAMUSCULAR | Status: AC
  Administered 2019-05-04: 4 mg via INTRAVENOUS
  Filled 2019-05-04: qty 2

## 2019-05-04 MED ORDER — ALUM & MAG HYDROXIDE-SIMETH 200-200-20 MG/5ML PO SUSP
30.0000 mL | Freq: Once | ORAL | Status: AC
  Administered 2019-05-04: 30 mL via ORAL
  Filled 2019-05-04: qty 30

## 2019-05-04 MED ORDER — KETOROLAC TROMETHAMINE 15 MG/ML IJ SOLN
15.0000 mg | Freq: Once | INTRAMUSCULAR | Status: AC
Start: 2019-05-04 — End: 2019-05-04
  Administered 2019-05-04: 15 mg via INTRAVENOUS
  Filled 2019-05-04: qty 1

## 2019-05-04 MED ORDER — ACETAMINOPHEN 500 MG PO TABS
1000.0000 mg | ORAL_TABLET | Freq: Once | ORAL | Status: AC
  Administered 2019-05-04: 1000 mg via ORAL
  Filled 2019-05-04: qty 2

## 2019-05-04 MED ORDER — CEPHALEXIN 500 MG PO CAPS: 500.0000 mg | ORAL_CAPSULE | Freq: Two times a day (BID) | ORAL | 0 refills | Status: AC

## 2019-05-04 NOTE — ED Provider Notes (Signed)
[place] [facility] EMERGENCY DEPARTMENT Provider Note   CSN: [id number] Arrival date & time: 05/04/19  1600     History Chief Complaint  Patient presents with  . Generalized Body Aches  . Cough    Wendy Brennan is a 43 y.o. female with history of Hep C and migraines who presents with body aches and chest pain.  Patient states that she initially started to feel sick 4 days ago.  She has been having central chest pain which is constant and worse with deep breaths and worse when she eats or drinks.  She has been having headache, chills, and body aches, nasal congestion, anosmia and loss of taste, sore throat, shortness of breath, abdominal pain, nausea, vomiting.  She has not had any fever or diarrhea.  She is been having urinary frequency and dysuria as well.  She states that she went to urgent care and they did an EKG which was normal.  They did a rapid Covid test as well and that was negative and therefore decided to send her to the ED for further evaluation of her chest pain due to family history of heart disease.  She denies any Covid contacts.  HPI     Past Medical History:  Diagnosis Date  . Hepatitis C   . Migraines     There are no problems to display for this patient.   Past Surgical History:  Procedure Laterality Date  . CESAREAN SECTION       OB History   No obstetric history on file.     No family history on file.  Social History   Tobacco Use  . Smoking status: Never Smoker  . Smokeless tobacco: Never Used  Substance Use Topics  . Alcohol use: No  . Drug use: No    Home Medications Prior to Admission medications   Medication Sig Start Date End Date Taking? Authorizing Provider  escitalopram (LEXAPRO) 10 MG tablet  06/18/18  Yes [provider]  Galcanezumab-gnlm 120 MG/ML SOAJ Inject into the skin. 02/21/18  Yes [provider]  ibuprofen (ADVIL) 400 MG tablet Take by mouth.   Yes [provider]  meloxicam (MOBIC)  15 MG tablet Take by mouth. 04/14/18  Yes [provider]  ondansetron (ZOFRAN) 8 MG tablet May take one tab every 4-6 hours as needed for nausea 08/20/18  Yes [provider]  Rimegepant Sulfate (NURTEC) 75 MG TBDP Take by mouth. 12/24/18  Yes [provider]  ibuprofen (ADVIL,MOTRIN) 800 MG tablet Take 1 tablet (800 mg total) by mouth 3 (three) times daily. 03/17/15   Charlesetta Shanks, MD  traMADol (ULTRAM) 50 MG tablet Take 2 tablets (100 mg total) by mouth every 6 (six) hours as needed. 03/17/15   Charlesetta Shanks, MD  UNKNOWN TO PATIENT meds x 2 for HA-does not know name    [provider]    Allergies    Patient has no known allergies.  Review of Systems   Review of Systems  Constitutional: Positive for activity change, appetite change, chills and fatigue. Negative for fever.  HENT: Positive for congestion, rhinorrhea and sore throat.   Eyes: Positive for redness.  Respiratory: Positive for cough and shortness of breath. Negative for wheezing.   Cardiovascular: Positive for chest pain. Negative for palpitations and leg swelling.  Gastrointestinal: Positive for abdominal pain, nausea and vomiting. Negative for diarrhea.  Genitourinary: Positive for frequency. Negative for dysuria and flank pain.  Musculoskeletal: Positive for back pain and myalgias.  Neurological:  Positive for headaches.  All other systems reviewed and are negative.   Physical Exam Updated Vital Signs BP 115/82   Pulse 68   Temp 98.1 F (36.7 C) (Oral)   Resp 20   Ht 5\' 3"  (1.6 m)   Wt 77.1 kg   LMP 05/04/2019   SpO2 100%   BMI 30.11 kg/m   Physical Exam Vitals and nursing note reviewed.  Constitutional:      General: She is not in acute distress.    Appearance: Normal appearance. She is well-developed. She is not ill-appearing.     Comments: Calm, cooperative. NAD  HENT:     Head: Normocephalic and atraumatic.     Right Ear: Tympanic membrane normal.     Left Ear:  Tympanic membrane normal.     Nose: Nose normal.     Mouth/Throat:     Mouth: Mucous membranes are moist.  Eyes:     General: No scleral icterus.       Right eye: No discharge.        Left eye: No discharge.     Conjunctiva/sclera: Conjunctivae normal.     Pupils: Pupils are equal, round, and reactive to light.  Cardiovascular:     Rate and Rhythm: Normal rate and regular rhythm.  Pulmonary:     Effort: Pulmonary effort is normal. No respiratory distress.     Breath sounds: Normal breath sounds.  Abdominal:     General: There is no distension.     Palpations: Abdomen is soft.     Tenderness: There is abdominal tenderness (mild epigastric).  Musculoskeletal:     Cervical back: Normal range of motion.  Skin:    General: Skin is warm and dry.  Neurological:     Mental Status: She is alert and oriented to person, place, and time.  Psychiatric:        Behavior: Behavior normal.     ED Results / Procedures / Treatments   Labs (all labs ordered are listed, but only abnormal results are displayed) Labs Reviewed  NOVEL CORONAVIRUS, NAA (HOSP ORDER, SEND-OUT TO REF LAB; TAT 18-24 HRS)  COMPREHENSIVE METABOLIC PANEL  CBC WITH DIFFERENTIAL/PLATELET  URINALYSIS, ROUTINE W REFLEX MICROSCOPIC    EKG None  Radiology No results found.  Procedures Procedures (including critical care time)  Medications Ordered in ED Medications  sodium chloride 0.9 % bolus 1,000 mL (has no administration in time range)  ketorolac (TORADOL) 15 MG/ML injection 15 mg (has no administration in time range)  alum & mag hydroxide-simeth (MAALOX/MYLANTA) 200-200-20 MG/5ML suspension 30 mL (has no administration in time range)  ondansetron (ZOFRAN) injection 4 mg (has no administration in time range)    ED Course  I have reviewed the triage vital signs and the nursing notes.  Pertinent labs & imaging results that were available during my care of the patient were reviewed by me and considered in my  medical decision making (see chart for details).  43 year old female with multiple symptoms that sound consistent with viral illness.  Her vital signs are normal.  On exam she is well-appearing although is uncomfortable.  HEENT exam is unremarkable.  Heart is regular rate and rhythm. No murmurs, gallops, rubs. Lungs are clear to auscultation although she has occasional dry coughing.  Chest pain is not reproducible with palpation. She is having epigastric tenderness.  I think likely she is having some GI upset and lung irritation causing her chest pain.  EKG at outside facility was sinus rhythm. Will  repeat this here and obtain labs, CXR and give meds for symptoms. Will also obtain PCR COVID swab.  At shift change imaging and labs are pending. With patient's over all well appearance and normal vitals I think she can be discharged after work up and symptom management. Care signed out to Quintella Baton Couture PA-C  MDM Rules/Calculators/A&P                      Final Clinical Impression(s) / ED Diagnoses Final diagnoses:  Atypical chest pain    Rx / DC Orders ED Discharge Orders    None       Bethel BornGekas, Keithen Capo Marie, PA-C 05/04/19 1806    Sabas SousBero, Michael M, MD 05/06/19 [phone]

## 2019-05-04 NOTE — Discharge Instructions (Addendum)
You were given a prescription for antibiotics. Please take the antibiotic prescription fully.   You were given medication for a cough. Please take as directed.   Today you were were tested for the coronavirus.  The results will be available in the next 2-3 days.  If the results are positive the hospital will contact you.  If they are negative the hospital would not contact you.  You will need to self quarantine until you are aware of your results.  If they are positive you will need to self quarantine as directed below.  You should be isolated for at least 7 days since the onset of your symptoms AND >72 hours after symptoms resolution (absence of fever without the use of fever reducing medication and improvement in respiratory symptoms), whichever is longer  Please follow up with your primary care provider within 5-7 days for re-evaluation of your symptoms. If you do not have a primary care provider, information for a healthcare clinic has been provided for you to make arrangements for follow up care. Please return to the emergency department for any new or worsening symptoms.

## 2019-05-04 NOTE — ED Triage Notes (Signed)
For 4 days pt has had body aches, cough, chills, headache, fatigue, weakness, loss of taste, loss of smell, vomiting and sob. She had a rapid covid test today that was negative.

## 2019-05-04 NOTE — ED Notes (Signed)
Pt. Verbalized understanding of discharge instructions 

## 2019-05-04 NOTE — ED Notes (Signed)
Pt. Spoke to her girls and told them she was coming home.

## 2019-05-04 NOTE — ED Provider Notes (Signed)
Care assumed from Janetta Hora, PA-C at shift change pending w/u. See her note for full H&P.  Per her note, "Wendy Brennan is a 43 y.o. female with history of Hep C and migraines who presents with body aches and chest pain.  Patient states that she initially started to feel sick 4 days ago.  She has been having central chest pain which is constant and worse with deep breaths and worse when she eats or drinks.  She has been having headache, chills, and body aches, nasal congestion, anosmia and loss of taste, sore throat, shortness of breath, abdominal pain, nausea, vomiting.  She has not had any fever or diarrhea.  She is been having urinary frequency and dysuria as well.  She states that she went to urgent care and they did an EKG which was normal.  They did a rapid Covid test as well and that was negative and therefore decided to send her to the ED for further evaluation of her chest pain due to family history of heart disease.  She denies any Covid contacts."   Physical Exam  BP 111/76   Pulse 65   Temp 98.1 F (36.7 C) (Oral)   Resp 18   Ht 5\' 3"  (1.6 m)   Wt 77.1 kg   LMP 05/04/2019   SpO2 99%   BMI 30.11 kg/m   Physical Exam Vitals and nursing note reviewed.  Constitutional:      General: She is not in acute distress.    Appearance: She is well-developed. She is not ill-appearing or toxic-appearing.  HENT:     Head: Normocephalic and atraumatic.  Eyes:     Conjunctiva/sclera: Conjunctivae normal.  Cardiovascular:     Rate and Rhythm: Normal rate.  Pulmonary:     Effort: Pulmonary effort is normal.  Musculoskeletal:        General: Normal range of motion.     Cervical back: Neck supple.  Skin:    General: Skin is warm and dry.  Neurological:     Mental Status: She is alert.      ED Course/Procedures     Procedures  Results for orders placed or performed during the hospital encounter of 05/04/19  Comprehensive metabolic panel  Result Value Ref Range   Sodium 136  135 - 145 mmol/L   Potassium 3.7 3.5 - 5.1 mmol/L   Chloride 107 98 - 111 mmol/L   CO2 23 22 - 32 mmol/L   Glucose, Bld 90 70 - 99 mg/dL   BUN 10 6 - 20 mg/dL   Creatinine, Ser 0.78 0.44 - 1.00 mg/dL   Calcium 8.2 (L) 8.9 - 10.3 mg/dL   Total Protein 7.0 6.5 - 8.1 g/dL   Albumin 3.7 3.5 - 5.0 g/dL   AST 22 15 - 41 U/L   ALT 17 0 - 44 U/L   Alkaline Phosphatase 60 38 - 126 U/L   Total Bilirubin 0.5 0.3 - 1.2 mg/dL   GFR calc non Af Amer >60 >60 mL/min   GFR calc Af Amer >60 >60 mL/min   Anion gap 6 5 - 15  CBC with Differential  Result Value Ref Range   WBC 3.6 (L) 4.0 - 10.5 K/uL   RBC 4.81 3.87 - 5.11 MIL/uL   Hemoglobin 14.0 12.0 - 15.0 g/dL   HCT 42.4 36.0 - 46.0 %   MCV 88.1 80.0 - 100.0 fL   MCH 29.1 26.0 - 34.0 pg   MCHC 33.0 30.0 - 36.0 g/dL  RDW 12.8 11.5 - 15.5 %   Platelets 164 150 - 400 K/uL   nRBC 0.0 0.0 - 0.2 %   Neutrophils Relative % 58 %   Neutro Abs 2.1 1.7 - 7.7 K/uL   Lymphocytes Relative 32 %   Lymphs Abs 1.1 0.7 - 4.0 K/uL   Monocytes Relative 10 %   Monocytes Absolute 0.4 0.1 - 1.0 K/uL   Eosinophils Relative 0 %   Eosinophils Absolute 0.0 0.0 - 0.5 K/uL   Basophils Relative 0 %   Basophils Absolute 0.0 0.0 - 0.1 K/uL   Immature Granulocytes 0 %   Abs Immature Granulocytes 0.01 0.00 - 0.07 K/uL  Urinalysis, Routine w reflex microscopic  Result Value Ref Range   Color, Urine RED (A) YELLOW   APPearance CLOUDY (A) CLEAR   Specific Gravity, Urine 1.020 1.005 - 1.030   pH 6.5 5.0 - 8.0   Glucose, UA NEGATIVE NEGATIVE mg/dL   Hgb urine dipstick LARGE (A) NEGATIVE   Bilirubin Urine NEGATIVE NEGATIVE   Ketones, ur NEGATIVE NEGATIVE mg/dL   Protein, ur 30 (A) NEGATIVE mg/dL   Nitrite NEGATIVE NEGATIVE   Leukocytes,Ua NEGATIVE NEGATIVE  Pregnancy, urine  Result Value Ref Range   Preg Test, Ur NEGATIVE NEGATIVE  Urinalysis, Microscopic (reflex)  Result Value Ref Range   RBC / HPF >50 0 - 5 RBC/hpf   WBC, UA 6-10 0 - 5 WBC/hpf   Bacteria, UA  MANY (A) NONE SEEN   Squamous Epithelial / LPF 6-10 0 - 5   DG Chest Portable 1 View  Result Date: 05/04/2019 CLINICAL DATA:  chest painFor 4 days pt has had body aches, cough, chills, headache, fatigue, weakness, loss of taste, loss of smell, vomiting and sob. She had a rapid covid test today that was negative. EXAM: PORTABLE CHEST 1 VIEW COMPARISON:  05/22/2018 from [facility] FINDINGS: Midline trachea. Normal heart size and mediastinal contours. No pleural effusion or pneumothorax. The Chin overlies the apices minimally. Clear lungs. Mild convex right thoracic spine curvature. IMPRESSION: No acute cardiopulmonary disease. Electronically Signed   By: Jeronimo GreavesKyle  Talbot M.D.   On: 05/04/2019 18:15    EKG Interpretation  Date/Time:  Monday May 04 2019 18:06:33 EST Ventricular Rate:  75 PR Interval:    QRS Duration: 86 QT Interval:  398 QTC Calculation: 363 R Axis:   81 Text Interpretation: Age not entered, assumed to be  43 years old for purpose of ECG interpretation Sinus rhythm No previous ECGs available Confirmed by Kennis CarinaBero, Michael [phone]([id number]) on 05/04/2019 7:29:34 PM         MDM   43 y/o F presenting for eval of URI sxs.  At shift change, patient pending laboratory work, chest x-ray and EKG.  If negative anticipate discharge home.  CBC with mild leukopenia, no anemia. CMP with normal electrolytes kidney and liver function. Urine pregnancy test negative Urinalysis with hematuria (on menses), greater than 50 RBCs, greater than 50 WBCs, many bacteria and 6-10 squamous epithelial cells.  This may represent contamination however patient is having urinary symptoms and will cover her with Keflex for possible UTI.   CXR is WNL.   EKG does not show any acute ischemic changes.  - sxs not typical for ACS, doubt PE. Pt reporting that chest pain 'feels like an itch' and is present with coughing  COVID testing was obtained.   10:33 PM reassessed patient.  She is laying comfortably in  bed.  With regards to her chest pain.  She states that the pain "feels like a tickle and usually happens only when she coughs.  Her vital signs are reassuring.  She is not tachycardic or hypotensive.  She is satting well on room air without signs of respiratory distress.  Speaking in full sentences.  Discussed the results of the work-up and plan for discharge with antibiotics for UTI and cough medication.  COVID test pending. Advise on quarantine measures and close follow-up with her PCP.  Advised on quarantine measures.  Advised on return precautions.  She voices understanding of the plan and reasons to return.  All questions answered.  Patient stable for discharge.  ---  Thereasa Parkin was evaluated in Emergency Department on 05/04/2019 for the symptoms described in the history of present illness. She was evaluated in the context of the global COVID-19 pandemic, which necessitated consideration that the patient might be at risk for infection with the SARS-CoV-2 virus that causes COVID-19. Institutional protocols and algorithms that pertain to the evaluation of patients at risk for COVID-19 are in a state of rapid change based on information released by regulatory bodies including the CDC and federal and state organizations. These policies and algorithms were followed during the patient's care in the ED.     Rayne Du 05/04/19 2253    Sabas Sous, MD 05/05/19 [phone]

## 2019-05-04 NOTE — ED Notes (Signed)
Pt. Is contacting her daughters at this time to let them know how she is doing.

## 2019-05-04 NOTE — ED Notes (Signed)
Pt. Reports on Christmas she started having a cough and mid sternal chest pain at the same time.  Pt. In no distress.  Pt. Reports the chest pain only in the center and goes thru to her back when she coughs.  Pt. Reports she has shortness of breath when she gets "too busy".  Dry cough noted by Pt.

## 2019-05-04 NOTE — ED Notes (Signed)
Pt given cup of water for PO challenge

## 2019-05-06 ENCOUNTER — Encounter (HOSPITAL_BASED_OUTPATIENT_CLINIC_OR_DEPARTMENT_OTHER): Payer: Self-pay

## 2019-05-06 ENCOUNTER — Emergency Department (HOSPITAL_BASED_OUTPATIENT_CLINIC_OR_DEPARTMENT_OTHER)
Admission: EM | Admit: 2019-05-06 | Discharge: 2019-05-06 | Disposition: A | Discharge status: Home / Self Care | Payer: BLUE CROSS/BLUE SHIELD | Attending: Emergency Medicine | Admitting: Emergency Medicine

## 2019-05-06 ENCOUNTER — Other Ambulatory Visit: Payer: Self-pay

## 2019-05-06 ENCOUNTER — Emergency Department (HOSPITAL_BASED_OUTPATIENT_CLINIC_OR_DEPARTMENT_OTHER): Payer: BLUE CROSS/BLUE SHIELD

## 2019-05-06 DIAGNOSIS — R05 Cough: Secondary | ICD-10-CM | POA: Diagnosis present

## 2019-05-06 DIAGNOSIS — R079 Chest pain, unspecified: Secondary | ICD-10-CM | POA: Insufficient documentation

## 2019-05-06 DIAGNOSIS — U071 COVID-19: Secondary | ICD-10-CM | POA: Insufficient documentation

## 2019-05-06 DIAGNOSIS — J1289 Other viral pneumonia: Secondary | ICD-10-CM | POA: Insufficient documentation

## 2019-05-06 DIAGNOSIS — Z79899 Other long term (current) drug therapy: Secondary | ICD-10-CM | POA: Insufficient documentation

## 2019-05-06 DIAGNOSIS — J1282 Pneumonia due to coronavirus disease 2019: Secondary | ICD-10-CM

## 2019-05-06 LAB — NOVEL CORONAVIRUS, NAA (HOSP ORDER, SEND-OUT TO REF LAB; TAT 18-24 HRS)

## 2019-05-06 LAB — SARS CORONAVIRUS 2 AG (30 MIN TAT): SARS Coronavirus 2 Ag: NEGATIVE

## 2019-05-06 LAB — D-DIMER, QUANTITATIVE: D-Dimer, Quant: 0.95 ug/mL-FEU — ABNORMAL HIGH (ref 0.00–0.50)

## 2019-05-06 LAB — TROPONIN I (HIGH SENSITIVITY): Troponin I (High Sensitivity): <2 ng/L (ref <18)

## 2019-05-06 MED ORDER — PREDNISONE 50 MG PO TABS: 50.0000 mg | ORAL_TABLET | Freq: Every day | ORAL | 0 refills | Status: DC

## 2019-05-06 MED ORDER — IOHEXOL 350 MG/ML SOLN
100.0000 mL | Freq: Once | INTRAVENOUS | Status: AC | PRN
  Administered 2019-05-06: 100 mL via INTRAVENOUS

## 2019-05-06 NOTE — Discharge Instructions (Addendum)
Continue quarantining as if you have tested positive for COVID. You need to quarantine for 14 days from symptom onset.

## 2019-05-06 NOTE — ED Provider Notes (Signed)
[facility] EMERGENCY DEPARTMENT Provider Note   CSN: [id number] Arrival date & time: 05/06/19  1252     History Chief Complaint  Patient presents with  . Cough    Wendy Brennan is a 43 y.o. female who presents to the ED today for reevaluation.  She was initially seen in the ED on 12/28 with complaints of body aches, chest pain, headache, chills, nasal congestion, loss of taste and smell, sore throat, shortness of breath.  Been seen at urgent care earlier that day and had a rapid Covid test which was negative.  Given complaint of chest pain and rapid test not being as sensitive she was sent to the ED for further evaluation.  There was concern as patient has family history of MIs at a young age.  She was seen in the ED and evaluated with lab work including CBC, CMP, UA, urine pregnant, PCR send out Covid test.  Patient had chest x-ray done at that time which was negative.  She had an EKG done which showed sinus rhythm.  There was no signs of ischemia.  She did not have troponin level drawn.   She returns today as she received a call regarding her Covid test being inconclusive and was told to come to the ED for reevaluation.  Patient states that she continues to cough and is taken over-the-counter cough medication without relief.  She states that she continues to have chest pain.  She has been taking Tylenol as needed for her headache but it does not seem to help with her chest pain.  She did try ibuprofen yesterday but did not notice any change in her chest pain either.  No recent prolonged travel or immobilization.  No history of DVT/PE.  No exogenous hormone use.  No active malignancy.  No hemoptysis.  She is a non-smoker.   The history is provided by the patient.       Past Medical History:  Diagnosis Date  . Hepatitis C   . Migraines     There are no problems to display for this patient.   Past Surgical History:  Procedure Laterality Date  . CESAREAN SECTION        OB History   No obstetric history on file.     No family history on file.  Social History   Tobacco Use  . Smoking status: Never Smoker  . Smokeless tobacco: Never Used  Substance Use Topics  . Alcohol use: No  . Drug use: No    Home Medications Prior to Admission medications   Medication Sig Start Date End Date Taking? Authorizing Provider  benzonatate (TESSALON) 100 MG capsule Take 1 capsule (100 mg total) by mouth every 8 (eight) hours for 5 days. 05/04/19 05/09/19  Couture, Cortni S, PA-C  cephALEXin (KEFLEX) 500 MG capsule Take 1 capsule (500 mg total) by mouth 2 (two) times daily for 7 days. 05/04/19 05/11/19  Couture, Cortni S, PA-C  escitalopram (LEXAPRO) 10 MG tablet  06/18/18   [provider]  Galcanezumab-gnlm 120 MG/ML SOAJ Inject into the skin. 02/21/18   [provider]  ibuprofen (ADVIL) 400 MG tablet Take by mouth.    [provider]  ibuprofen (ADVIL,MOTRIN) 800 MG tablet Take 1 tablet (800 mg total) by mouth 3 (three) times daily. 03/17/15   Arby Barrette, MD  meloxicam (MOBIC) 15 MG tablet Take by mouth. 04/14/18   [provider]  ondansetron (ZOFRAN) 8 MG tablet May take one tab every 4-6 hours  as needed for nausea 08/20/18   [provider]  Rimegepant Sulfate (NURTEC) 75 MG TBDP Take by mouth. 12/24/18   [provider]  traMADol (ULTRAM) 50 MG tablet Take 2 tablets (100 mg total) by mouth every 6 (six) hours as needed. 03/17/15   Charlesetta Shanks, MD  UNKNOWN TO PATIENT meds x 2 for HA-does not know name    [provider]    Allergies    Patient has no known allergies.  Review of Systems   Review of Systems  Constitutional: Positive for chills and fatigue. Negative for diaphoresis.  HENT: Positive for congestion.   Eyes: Negative for visual disturbance.  Respiratory: Positive for cough and shortness of breath.   Cardiovascular: Positive for chest pain.  Gastrointestinal: Negative for  diarrhea, nausea and vomiting.  Genitourinary: Negative for difficulty urinating.  Musculoskeletal: Negative for neck pain and neck stiffness.  Skin: Negative for rash.  Neurological: Positive for headaches. Negative for dizziness and light-headedness.    Physical Exam Updated Vital Signs BP 121/90 (BP Location: Right Arm)   Pulse 66   Temp 98.8 F (37.1 C) (Oral)   Resp 16   LMP 05/04/2019   SpO2 100%   Physical Exam Vitals and nursing note reviewed.  Constitutional:      Appearance: She is not ill-appearing or diaphoretic.  HENT:     Head: Normocephalic and atraumatic.  Eyes:     Conjunctiva/sclera: Conjunctivae normal.  Cardiovascular:     Rate and Rhythm: Normal rate and regular rhythm.     Pulses: Normal pulses.  Pulmonary:     Effort: Pulmonary effort is normal.     Breath sounds: Normal breath sounds. No wheezing, rhonchi or rales.     Comments: Able to speak in full sentences without difficulty. No accessory muscle use. LCTAB. Satting 100% on RA.  Chest:     Chest wall: No tenderness.  Abdominal:     Palpations: Abdomen is soft.     Tenderness: There is no abdominal tenderness. There is no guarding or rebound.  Musculoskeletal:     Cervical back: Neck supple.  Skin:    General: Skin is warm and dry.     Coloration: Skin is not jaundiced.  Neurological:     Mental Status: She is alert.     ED Results / Procedures / Treatments   Labs (all labs ordered are listed, but only abnormal results are displayed) Labs Reviewed  SARS CORONAVIRUS 2 AG (30 MIN TAT)  D-DIMER, QUANTITATIVE (NOT AT [facility])  TROPONIN I (HIGH SENSITIVITY)    EKG None  Radiology No results found.  Procedures Procedures (including critical care time)  Medications Ordered in ED Medications - No data to display  ED Course  I have reviewed the triage vital signs and the nursing notes.  Pertinent labs & imaging results that were available during my care of the patient were reviewed  by me and considered in my medical decision making (see chart for details).  43 year old female who presents to the ED today after inconclusive Covid test.  Recently seen on 12/28 for URI-like symptoms with concern for Covid.  She has had persistent chest pain with coughing and there is suspected costochondritis.  Patient states he has been taking Tylenol with mild relief.  She reports that her chest pain has continued.  I had initially ordered a rapid Covid test as patient walked in the door given I saw that her PCR test was inconclusive.  This is negative today.  This patient's third Covid swab which all have been negative.  Do not feel she needs additional Covid swabs at this time.  Patient will just need to self isolate at home and act like she is Covid positive.  In terms of her chest pain it does not look like she was worked up for ACS 2 days ago.  She has risk factors in terms of her family but otherwise is risk factor free.  Her chest pain is atypical for ACS but considering she is here today will obtain troponin.  EKG again today without ischemic changes.  Do not feel she needs a repeat chest x-ray at this time.  He is nontachycardic, nontachypneic and her temp is within normal limits.  She is satting 100% on room air.  She has no risk factors for PE at this time except suspected Covid.  Given this will obtain D-dimer to definitively rule out as patient otherwise PERC negative.   Pt refusing EKG at this time as she had one done 2 days ago.   At shift change case signed out to Langston MaskerKaren Sofia, PA-C, who will dispo patient accordingly if D dimer and troponin return. Do not think pt requires delta trop given symptoms have been ongoing for > 6 days at this time.     MDM Rules/Calculators/A&P                       Final Clinical Impression(s) / ED Diagnoses Final diagnoses:  Viral illness  Nonspecific chest pain    Rx / DC Orders ED Discharge Orders    None       Tanda RockersVenter, Ociel Retherford,  PA-C 05/06/19 1809    Rolan BuccoBelfi, Melanie, MD 05/06/19 [phone]

## 2019-05-06 NOTE — ED Notes (Signed)
ED Provider at bedside. 

## 2019-05-06 NOTE — ED Provider Notes (Signed)
Pt's care assumed at 6:30 pm.  Pt has an elevated ddimer of 95.  Ct scan obtasined and shows multi focal pneumonia.  Radiologist reports ct looks like covid.  Pt counseled on results.  Pt is advised to quarantine. Covid ordered.  Pt advised to finish medication prescribed for uti.  Pt given note for work    Sidney Ace 05/06/19 2156    Malvin Johns, MD 05/06/19 [phone]

## 2019-05-06 NOTE — ED Notes (Signed)
PT refusing EKG

## 2019-05-06 NOTE — ED Triage Notes (Signed)
Pt states she was called by a nurse to return if sx continue and that something was wrong with covid test-NAD-steady gait

## 2019-05-07 LAB — SARS CORONAVIRUS 2 (TAT 6-24 HRS): SARS Coronavirus 2: POSITIVE — AB

## 2019-05-08 ENCOUNTER — Encounter: Payer: Self-pay | Admitting: Infectious Diseases

## 2020-03-25 ENCOUNTER — Encounter: Payer: Self-pay | Admitting: Physician Assistant

## 2020-04-08 HISTORY — PX: OTHER SURGICAL HISTORY: SHX169

## 2020-04-27 ENCOUNTER — Ambulatory Visit: Payer: BLUE CROSS/BLUE SHIELD | Admitting: Physician Assistant

## 2021-11-26 IMAGING — CT CT ANGIO CHEST
2 of 8 series · 19 of 36 positions shown · IV contrast (Omnipaque)
Comparison: Chest radiograph dated 05/04/2019.

CLINICAL DATA: Body aches, cough, chills, headache, fatigue,
weakness, loss of taste/smell, vomiting, and shortness of breath.
Negative COVID test today.

EXAM:
CT ANGIOGRAPHY CHEST WITH CONTRAST
TECHNIQUE: Multidetector CT imaging of the chest was performed using the
standard protocol during bolus administration of intravenous
contrast. Multiplanar CT image reconstructions and MIPs were
obtained to evaluate the vascular anatomy.
CONTRAST:  100mL OMNIPAQUE IOHEXOL 350 MG/ML SOLN

[Series 6: pe coronal mpr · coronal · 0.49mm/px · 1 of 133 slices shown]
[im 67/133  mediastinal]
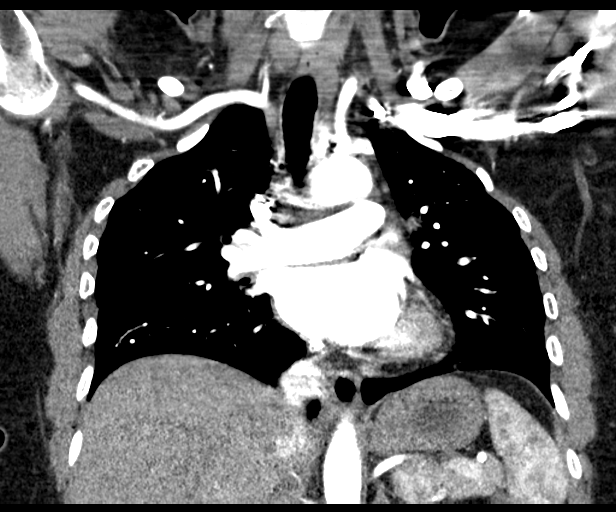

[Series 10: pe thins · axial · 0.66mm/px · z∈[+858,+1074]mm · 18 of 242 slices shown]
[im 13/242  lung]
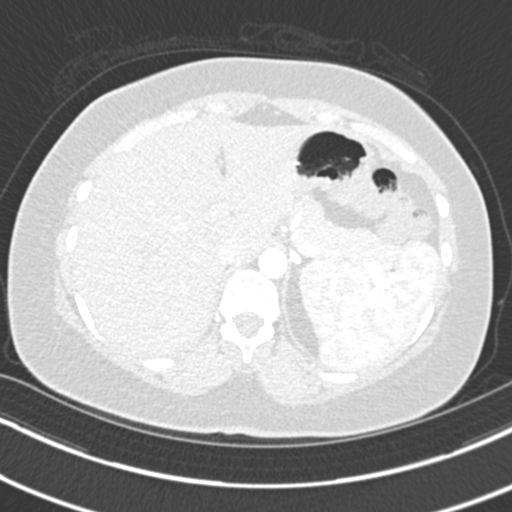
[im 26/242  mediastinal]
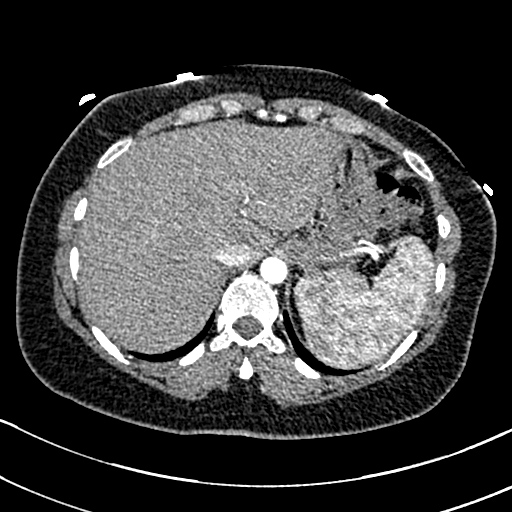
[im 39/242  lung]
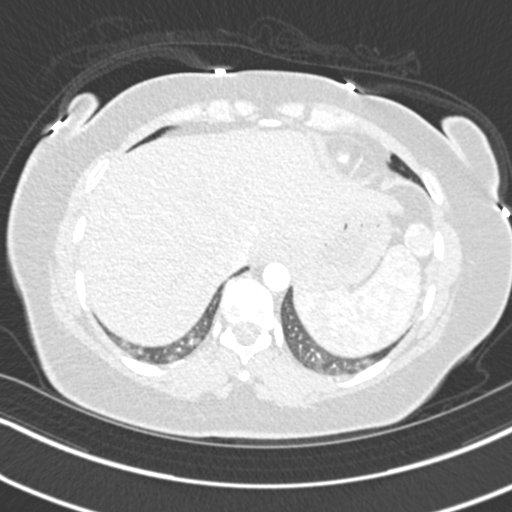
[im 51/242  mediastinal]
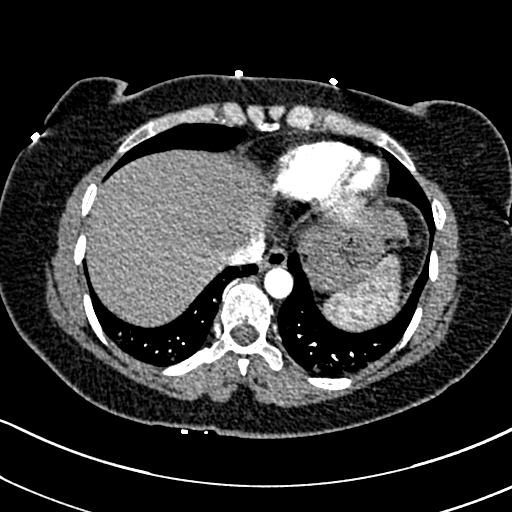
[im 64/242  lung]
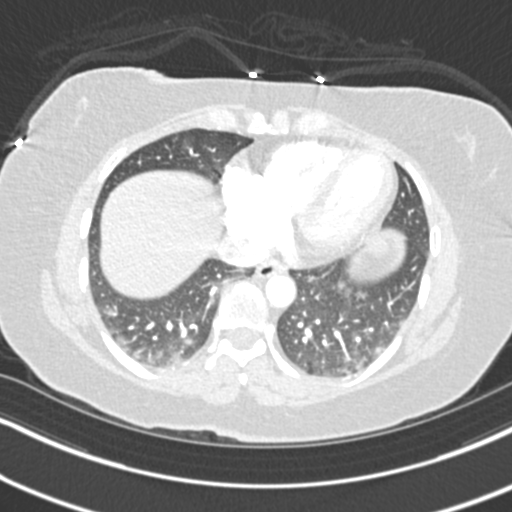
[im 77/242  mediastinal]
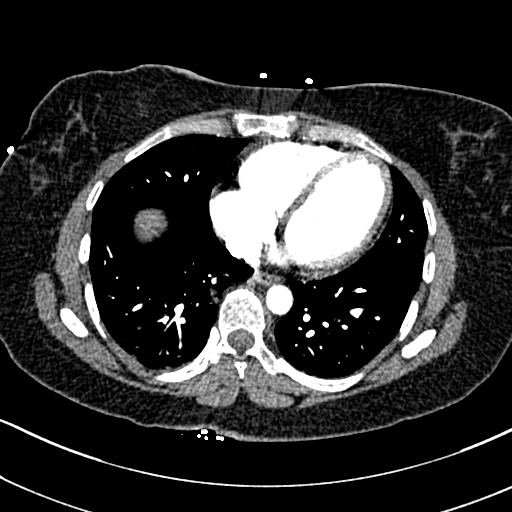
[im 89/242  lung]
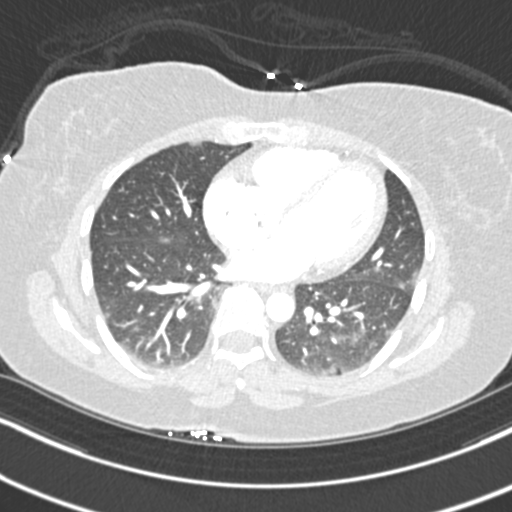
[im 102/242  mediastinal]
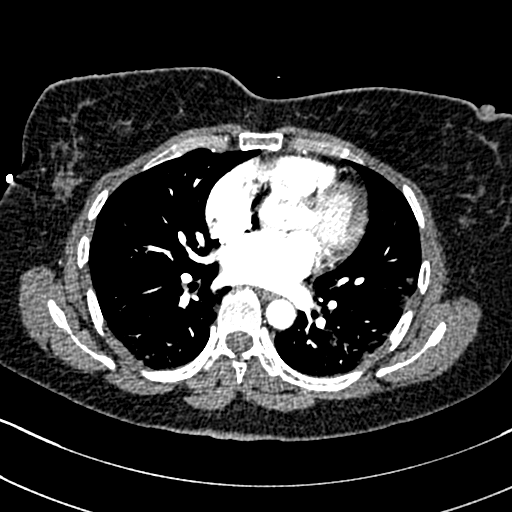
[im 115/242  lung]
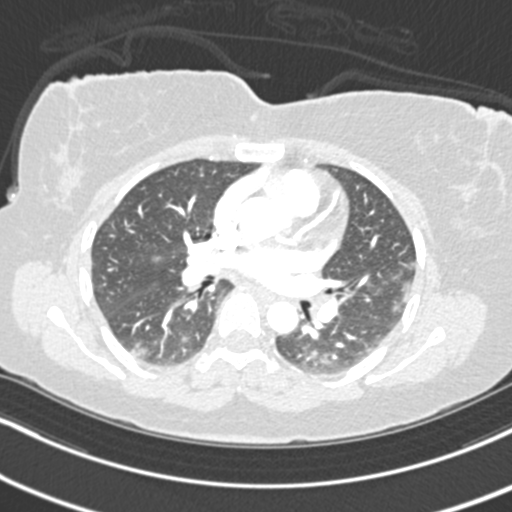
[im 127/242  mediastinal]
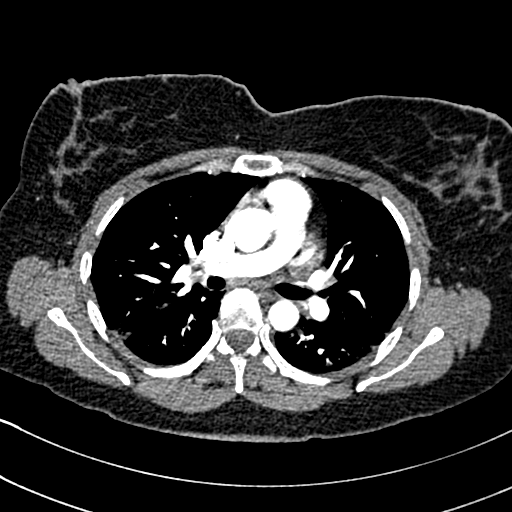
[im 140/242  lung]
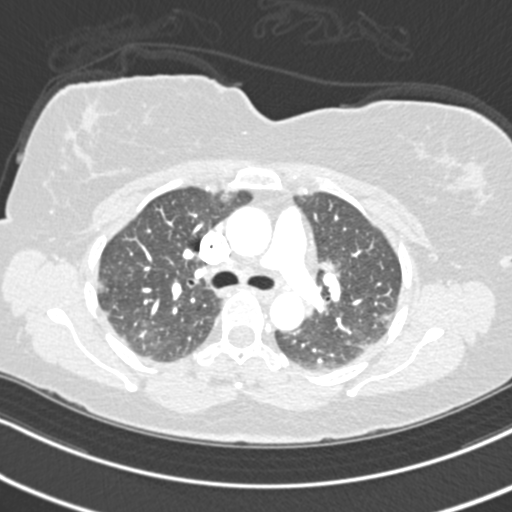
[im 153/242  mediastinal]
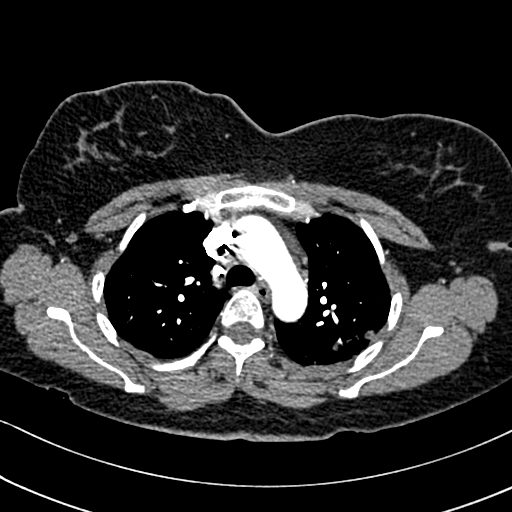
[im 165/242  lung]
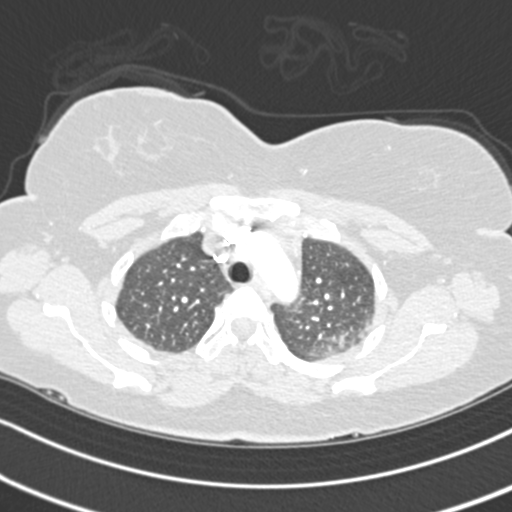
[im 178/242  mediastinal]
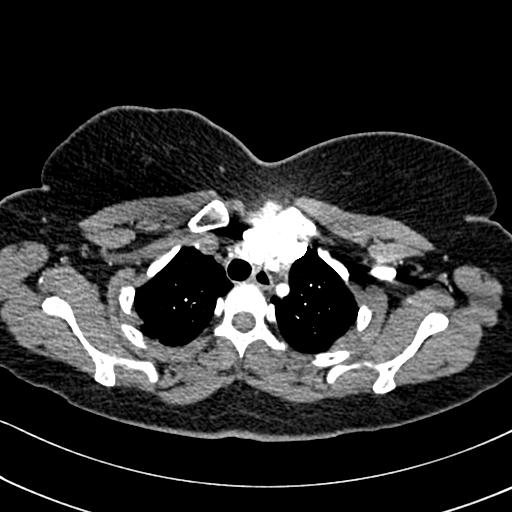
[im 191/242  lung]
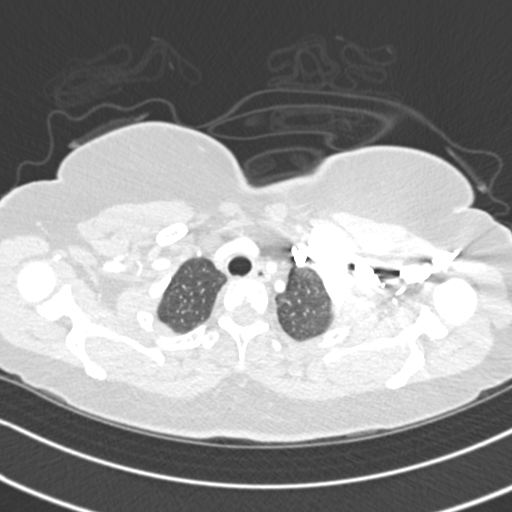
[im 203/242  mediastinal]
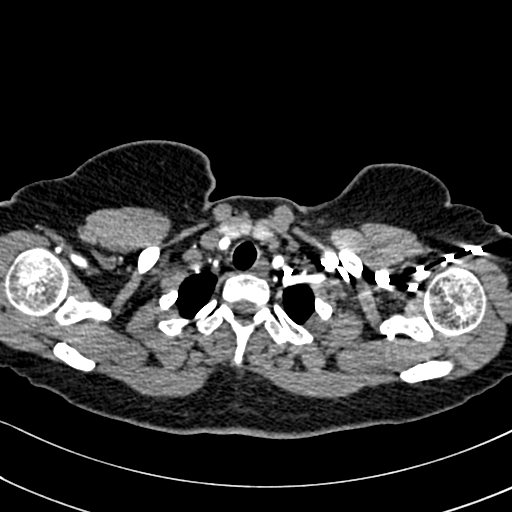
[im 216/242  lung]
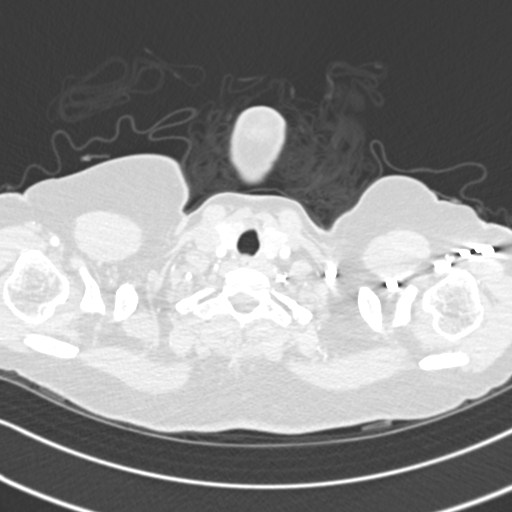
[im 229/242  mediastinal]
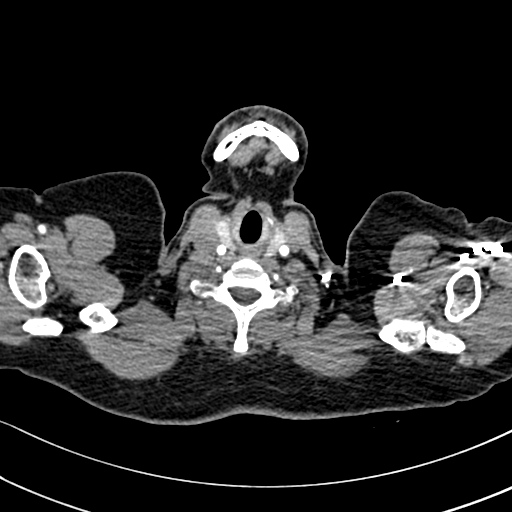

[19 of 36 positions shown; findings below may reference images not displayed]

FINDINGS: Cardiovascular: Satisfactory opacification of the bilateral
pulmonary arteries to the segmental level. No evidence of pulmonary
embolism.

No evidence of thoracic aortic aneurysm or dissection.

Heart is normal in size.  No pericardial effusion.

Mediastinum/Nodes: No suspicious mediastinal lymphadenopathy.

Visualized thyroid is unremarkable.

Lungs/Pleura: Patchy/ground-glass opacities, lower lobe predominant,
with a subpleural/peripheral distribution.

No suspicious pulmonary nodules.

No pleural effusion or pneumothorax.

Upper Abdomen: Visualized upper abdomen is grossly unremarkable.

Musculoskeletal: Mild degenerative changes of the visualized
thoracolumbar spine.

Review of the MIP images confirms the above findings.
IMPRESSION: No evidence of pulmonary embolism.

Multifocal pneumonia. This appearance/distribution is considered
highly suspicious for COVID in this clinical setting, despite a
known negative rapid test today.

These results were called by telephone at the time of interpretation
on 05/06/2019 at [DATE] to provider DAIRA PENNEY, who verbally
acknowledged these results.
